# Patient Record
Sex: Male | Born: 1995 | Race: White | Hispanic: No | Marital: Single
Health system: Southern US, Community
[De-identification: ages and names within clinical notes are randomized; demographics above are authoritative.]

---

## 2016-04-18 ENCOUNTER — Emergency Department (HOSPITAL_COMMUNITY): Payer: BLUE CROSS/BLUE SHIELD | Admitting: Anesthesiology

## 2016-04-18 ENCOUNTER — Emergency Department (HOSPITAL_COMMUNITY): Payer: BLUE CROSS/BLUE SHIELD

## 2016-04-18 ENCOUNTER — Encounter (HOSPITAL_COMMUNITY): Payer: Self-pay

## 2016-04-18 ENCOUNTER — Encounter (HOSPITAL_COMMUNITY)
Admission: EM | Disposition: A | Discharge status: Home / Self Care | Payer: Self-pay | Source: Home / Self Care | Attending: Emergency Medicine

## 2016-04-18 ENCOUNTER — Ambulatory Visit (HOSPITAL_COMMUNITY)
Admission: EM | Admit: 2016-04-18 | Discharge: 2016-04-18 | Disposition: A | Discharge status: Home / Self Care | Payer: BLUE CROSS/BLUE SHIELD | Attending: Emergency Medicine | Admitting: Emergency Medicine

## 2016-04-18 DIAGNOSIS — S66922A Laceration of unspecified muscle, fascia and tendon at wrist and hand level, left hand, initial encounter: Secondary | ICD-10-CM

## 2016-04-18 DIAGNOSIS — Y939 Activity, unspecified: Secondary | ICD-10-CM | POA: Insufficient documentation

## 2016-04-18 DIAGNOSIS — S61412A Laceration without foreign body of left hand, initial encounter: Secondary | ICD-10-CM

## 2016-04-18 DIAGNOSIS — Y92410 Unspecified street and highway as the place of occurrence of the external cause: Secondary | ICD-10-CM | POA: Diagnosis not present

## 2016-04-18 DIAGNOSIS — Z01811 Encounter for preprocedural respiratory examination: Secondary | ICD-10-CM

## 2016-04-18 DIAGNOSIS — S66320A Laceration of extensor muscle, fascia and tendon of right index finger at wrist and hand level, initial encounter: Secondary | ICD-10-CM | POA: Diagnosis not present

## 2016-04-18 DIAGNOSIS — Z23 Encounter for immunization: Secondary | ICD-10-CM | POA: Insufficient documentation

## 2016-04-18 HISTORY — PX: NERVE AND ARTERY REPAIR: SHX5694

## 2016-04-18 LAB — CBC WITH DIFFERENTIAL/PLATELET
BASOS ABS: 0 10*3/uL (ref 0.0–0.1)
Basophils Relative: 0 %
EOS ABS: 0 10*3/uL (ref 0.0–0.7)
Eosinophils Relative: 0 %
HEMATOCRIT: 40.7 % (ref 39.0–52.0)
Hemoglobin: 14 g/dL (ref 13.0–17.0)
Lymphocytes Relative: 10 %
Lymphs Abs: 1.2 10*3/uL (ref 0.7–4.0)
MCH: 29.2 pg (ref 26.0–34.0)
MCHC: 34.4 g/dL (ref 30.0–36.0)
MCV: 84.8 fL (ref 78.0–100.0)
MONO ABS: 0.6 10*3/uL (ref 0.1–1.0)
MONOS PCT: 5 %
NEUTROS ABS: 10.3 10*3/uL — AB (ref 1.7–7.7)
Neutrophils Relative %: 85 %
PLATELETS: 128 10*3/uL — AB (ref 150–400)
RBC: 4.8 MIL/uL (ref 4.22–5.81)
RDW: 12.5 % (ref 11.5–15.5)
WBC: 12.2 10*3/uL — ABNORMAL HIGH (ref 4.0–10.5)

## 2016-04-18 LAB — BASIC METABOLIC PANEL
ANION GAP: 10 (ref 5–15)
BUN: 10 mg/dL (ref 6–20)
CALCIUM: 8.8 mg/dL — AB (ref 8.9–10.3)
CO2: 22 mmol/L (ref 22–32)
CREATININE: 0.83 mg/dL (ref 0.61–1.24)
Chloride: 108 mmol/L (ref 101–111)
GLUCOSE: 110 mg/dL — AB (ref 65–99)
Potassium: 3.3 mmol/L — ABNORMAL LOW (ref 3.5–5.1)
Sodium: 140 mmol/L (ref 135–145)

## 2016-04-18 LAB — ABO/RH: ABO/RH(D): O POS

## 2016-04-18 LAB — TYPE AND SCREEN
ABO/RH(D): O POS
ANTIBODY SCREEN: NEGATIVE

## 2016-04-18 SURGERY — NERVE AND ARTERY REPAIR
Anesthesia: Choice | Site: Hand | Laterality: Right

## 2016-04-18 MED ORDER — PROPOFOL 10 MG/ML IV BOLUS
INTRAVENOUS | Status: AC
  Filled 2016-04-18: qty 40

## 2016-04-18 MED ORDER — SODIUM CHLORIDE 0.9 % IJ SOLN
INTRAMUSCULAR | Status: AC
  Filled 2016-04-18: qty 10

## 2016-04-18 MED ORDER — MIDAZOLAM HCL 2 MG/2ML IJ SOLN
INTRAMUSCULAR | Status: AC
  Filled 2016-04-18: qty 2

## 2016-04-18 MED ORDER — FENTANYL CITRATE (PF) 100 MCG/2ML IJ SOLN
50.0000 ug | Freq: Once | INTRAMUSCULAR | Status: AC
  Administered 2016-04-18: 50 ug via INTRAVENOUS

## 2016-04-18 MED ORDER — BUPIVACAINE-EPINEPHRINE (PF) 0.5% -1:200000 IJ SOLN
INTRAMUSCULAR | Status: DC | PRN
  Administered 2016-04-18: 10 mL

## 2016-04-18 MED ORDER — BUPIVACAINE HCL (PF) 0.25 % IJ SOLN
INTRAMUSCULAR | Status: AC
  Filled 2016-04-18: qty 30

## 2016-04-18 MED ORDER — ROCURONIUM BROMIDE 50 MG/5ML IV SOSY
PREFILLED_SYRINGE | INTRAVENOUS | Status: AC
  Filled 2016-04-18: qty 5

## 2016-04-18 MED ORDER — TETANUS-DIPHTH-ACELL PERTUSSIS 5-2.5-18.5 LF-MCG/0.5 IM SUSP
0.5000 mL | Freq: Once | INTRAMUSCULAR | Status: AC
  Administered 2016-04-18: 0.5 mL via INTRAMUSCULAR
  Filled 2016-04-18: qty 0.5

## 2016-04-18 MED ORDER — FENTANYL CITRATE (PF) 250 MCG/5ML IJ SOLN
INTRAMUSCULAR | Status: DC | PRN
  Administered 2016-04-18 (×2): 100 ug via INTRAVENOUS

## 2016-04-18 MED ORDER — SODIUM CHLORIDE 0.9 % IV SOLN
INTRAVENOUS | Status: DC | PRN
  Administered 2016-04-18: 17:00:00 via INTRAVENOUS

## 2016-04-18 MED ORDER — ONDANSETRON HCL 4 MG/2ML IJ SOLN
INTRAMUSCULAR | Status: AC
  Filled 2016-04-18: qty 2

## 2016-04-18 MED ORDER — BUPIVACAINE-EPINEPHRINE (PF) 0.5% -1:200000 IJ SOLN
INTRAMUSCULAR | Status: AC
  Filled 2016-04-18: qty 30

## 2016-04-18 MED ORDER — MIDAZOLAM HCL 5 MG/5ML IJ SOLN
INTRAMUSCULAR | Status: DC | PRN
  Administered 2016-04-18: 2 mg via INTRAVENOUS

## 2016-04-18 MED ORDER — ARTIFICIAL TEARS OP OINT
TOPICAL_OINTMENT | OPHTHALMIC | Status: AC
  Filled 2016-04-18: qty 3.5

## 2016-04-18 MED ORDER — PHENYLEPHRINE 40 MCG/ML (10ML) SYRINGE FOR IV PUSH (FOR BLOOD PRESSURE SUPPORT)
PREFILLED_SYRINGE | INTRAVENOUS | Status: AC
  Filled 2016-04-18: qty 10

## 2016-04-18 MED ORDER — LIDOCAINE HCL (CARDIAC) 20 MG/ML IV SOLN
INTRAVENOUS | Status: DC | PRN
  Administered 2016-04-18: 40 mg via INTRATRACHEAL

## 2016-04-18 MED ORDER — ONDANSETRON HCL 4 MG/2ML IJ SOLN
INTRAMUSCULAR | Status: DC | PRN
  Administered 2016-04-18: 4 mg via INTRAVENOUS

## 2016-04-18 MED ORDER — SUCCINYLCHOLINE 20MG/ML (10ML) SYRINGE FOR MEDFUSION PUMP - OPTIME
INTRAMUSCULAR | Status: DC | PRN
  Administered 2016-04-18: 120 mg via INTRAVENOUS

## 2016-04-18 MED ORDER — LORAZEPAM 2 MG/ML IJ SOLN
0.5000 mg | Freq: Once | INTRAMUSCULAR | Status: AC
  Administered 2016-04-18: 0.5 mg via INTRAVENOUS
  Filled 2016-04-18: qty 1

## 2016-04-18 MED ORDER — FENTANYL CITRATE (PF) 100 MCG/2ML IJ SOLN
INTRAMUSCULAR | Status: AC
  Filled 2016-04-18: qty 4

## 2016-04-18 MED ORDER — LIDOCAINE 2% (20 MG/ML) 5 ML SYRINGE
INTRAMUSCULAR | Status: AC
  Filled 2016-04-18: qty 5

## 2016-04-18 MED ORDER — CEFAZOLIN IN D5W 1 GM/50ML IV SOLN
INTRAVENOUS | Status: DC | PRN
  Administered 2016-04-18: 1 g via INTRAVENOUS

## 2016-04-18 MED ORDER — EPHEDRINE 5 MG/ML INJ
INTRAVENOUS | Status: AC
  Filled 2016-04-18: qty 10

## 2016-04-18 MED ORDER — OXYCODONE-ACETAMINOPHEN 5-325 MG PO TABS: 1.0000 | ORAL_TABLET | ORAL | 0 refills | Status: AC | PRN

## 2016-04-18 MED ORDER — FENTANYL CITRATE (PF) 100 MCG/2ML IJ SOLN
100.0000 ug | Freq: Once | INTRAMUSCULAR | Status: DC
  Filled 2016-04-18: qty 2

## 2016-04-18 MED ORDER — 0.9 % SODIUM CHLORIDE (POUR BTL) OPTIME
TOPICAL | Status: DC | PRN
  Administered 2016-04-18: 1000 mL

## 2016-04-18 MED ORDER — LIDOCAINE-EPINEPHRINE (PF) 2 %-1:200000 IJ SOLN
20.0000 mL | Freq: Once | INTRAMUSCULAR | Status: AC
  Administered 2016-04-18: 20 mL

## 2016-04-18 MED ORDER — PROPOFOL 10 MG/ML IV BOLUS
INTRAVENOUS | Status: DC | PRN
  Administered 2016-04-18: 100 mg via INTRAVENOUS
  Administered 2016-04-18: 200 mg via INTRAVENOUS

## 2016-04-18 MED ORDER — CEFAZOLIN IN D5W 1 GM/50ML IV SOLN
1.0000 g | Freq: Once | INTRAVENOUS | Status: AC
  Administered 2016-04-18: 1 g via INTRAVENOUS
  Filled 2016-04-18: qty 50

## 2016-04-18 MED ORDER — SUCCINYLCHOLINE CHLORIDE 200 MG/10ML IV SOSY
PREFILLED_SYRINGE | INTRAVENOUS | Status: AC
  Filled 2016-04-18: qty 10

## 2016-04-18 MED ORDER — CHLORHEXIDINE GLUCONATE 4 % EX LIQD: 60.0000 mL | Freq: Once | CUTANEOUS | Status: DC

## 2016-04-18 MED ORDER — CEFAZOLIN SODIUM-DEXTROSE 2-4 GM/100ML-% IV SOLN
2.0000 g | INTRAVENOUS | Status: DC
Start: 2016-04-19 — End: 2016-04-18

## 2016-04-18 SURGICAL SUPPLY — 55 items
BANDAGE ACE 4X5 VEL STRL LF (GAUZE/BANDAGES/DRESSINGS) ×3 IMPLANT
BNDG CMPR 9X4 STRL LF SNTH (GAUZE/BANDAGES/DRESSINGS) ×1
BNDG ESMARK 4X9 LF (GAUZE/BANDAGES/DRESSINGS) ×3 IMPLANT
BNDG GAUZE ELAST 4 BULKY (GAUZE/BANDAGES/DRESSINGS) ×3 IMPLANT
CLOSURE WOUND 1/2 X4 (GAUZE/BANDAGES/DRESSINGS)
CORDS BIPOLAR (ELECTRODE) ×3 IMPLANT
COVER SURGICAL LIGHT HANDLE (MISCELLANEOUS) ×3 IMPLANT
CUFF TOURNIQUET SINGLE 18IN (TOURNIQUET CUFF) ×3 IMPLANT
CUFF TOURNIQUET SINGLE 24IN (TOURNIQUET CUFF) IMPLANT
DECANTER SPIKE VIAL GLASS SM (MISCELLANEOUS) ×3 IMPLANT
DRAPE OEC MINIVIEW 54X84 (DRAPES) IMPLANT
DRAPE SURG 17X23 STRL (DRAPES) ×3 IMPLANT
DURAPREP 26ML APPLICATOR (WOUND CARE) IMPLANT
GAUZE SPONGE 4X4 12PLY STRL (GAUZE/BANDAGES/DRESSINGS) ×3 IMPLANT
GAUZE XEROFORM 1X8 LF (GAUZE/BANDAGES/DRESSINGS) ×3 IMPLANT
GLOVE SURG SYN 8.0 (GLOVE) ×3 IMPLANT
GOWN STRL REUS W/ TWL LRG LVL3 (GOWN DISPOSABLE) ×1 IMPLANT
GOWN STRL REUS W/ TWL XL LVL3 (GOWN DISPOSABLE) ×1 IMPLANT
GOWN STRL REUS W/TWL LRG LVL3 (GOWN DISPOSABLE) ×3
GOWN STRL REUS W/TWL XL LVL3 (GOWN DISPOSABLE) ×3
KIT BASIN OR (CUSTOM PROCEDURE TRAY) ×3 IMPLANT
KIT ROOM TURNOVER OR (KITS) ×3 IMPLANT
LOOP VESSEL MAXI BLUE (MISCELLANEOUS) IMPLANT
MANIFOLD NEPTUNE II (INSTRUMENTS) IMPLANT
NEEDLE HYPO 25GX1X1/2 BEV (NEEDLE) ×3 IMPLANT
NS IRRIG 1000ML POUR BTL (IV SOLUTION) ×3 IMPLANT
PACK ORTHO EXTREMITY (CUSTOM PROCEDURE TRAY) ×3 IMPLANT
PAD ARMBOARD 7.5X6 YLW CONV (MISCELLANEOUS) ×6 IMPLANT
PAD CAST 4YDX4 CTTN HI CHSV (CAST SUPPLIES) ×1 IMPLANT
PADDING CAST COTTON 4X4 STRL (CAST SUPPLIES) ×3
SPEAR EYE SURG WECK-CEL (MISCELLANEOUS) ×6 IMPLANT
SPECIMEN JAR SMALL (MISCELLANEOUS) IMPLANT
STRIP CLOSURE SKIN 1/2X4 (GAUZE/BANDAGES/DRESSINGS) IMPLANT
SUCTION FRAZIER HANDLE 10FR (MISCELLANEOUS) ×2
SUCTION TUBE FRAZIER 10FR DISP (MISCELLANEOUS) ×1 IMPLANT
SUT ETHIBOND 3-0 V-5 (SUTURE) IMPLANT
SUT ETHILON 4 0 PS 2 18 (SUTURE) ×6 IMPLANT
SUT ETHILON 5 0 PS 2 18 (SUTURE) IMPLANT
SUT FIBERWIRE 3-0 18 TAPR NDL (SUTURE) ×3
SUT MERSILENE 4 0 P 3 (SUTURE) IMPLANT
SUT PROLENE 3 0 PS 2 (SUTURE) IMPLANT
SUT PROLENE 6 0 P 1 18 (SUTURE) ×3 IMPLANT
SUT SILK 4 0 PS 2 (SUTURE) IMPLANT
SUT VIC AB 3-0 FS2 27 (SUTURE) IMPLANT
SUT VIC AB 4-0 P-3 18X BRD (SUTURE) ×1 IMPLANT
SUT VIC AB 4-0 P3 18 (SUTURE) ×3
SUT VICRYL RAPIDE 4/0 PS 2 (SUTURE) IMPLANT
SUTURE FIBERWR 3-0 18 TAPR NDL (SUTURE) ×1 IMPLANT
SYR CONTROL 10ML LL (SYRINGE) ×3 IMPLANT
TOWEL OR 17X24 6PK STRL BLUE (TOWEL DISPOSABLE) ×3 IMPLANT
TOWEL OR 17X26 10 PK STRL BLUE (TOWEL DISPOSABLE) ×3 IMPLANT
TUBE CONNECTING 12'X1/4 (SUCTIONS) ×1
TUBE CONNECTING 12X1/4 (SUCTIONS) ×2 IMPLANT
UNDERPAD 30X30 (UNDERPADS AND DIAPERS) ×3 IMPLANT
WATER STERILE IRR 1000ML POUR (IV SOLUTION) ×3 IMPLANT
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
## 2016-04-18 NOTE — Transfer of Care (Signed)
Immediate Anesthesia Transfer of Care Note  Patient: Joe Manning  Procedure(s) Performed: Procedure(s): EXPLORATION AND REPAIRS AS NECESSARY TO LEFT  HAND (Right)  Patient Location: PACU  Anesthesia Type:General  Level of Consciousness: awake  Airway & Oxygen Therapy: Patient Spontanous Breathing  Post-op Assessment: Report given to RN and Post -op Vital signs reviewed and stable  Post vital signs: Reviewed and stable  Last Vitals:  Vitals:   04/18/16 1443 04/18/16 1630  BP: (!) 110/50 115/69  Pulse:  82  Resp: 12   Temp: 36.9 C     Last Pain:  Vitals:   04/18/16 1532  TempSrc:   PainSc: 5          Complications: No apparent anesthesia complications

## 2016-04-18 NOTE — Op Note (Signed)
See note [id number]
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
## 2016-04-18 NOTE — Anesthesia Preprocedure Evaluation (Addendum)
Anesthesia Evaluation  Patient identified by MRN, date of birth, ID band Patient awake    Reviewed: NPO status , Patient's Chart, lab work & pertinent test results, Unable to perform ROS - Chart review only  Airway Mallampati: I  TM Distance: >3 FB Neck ROM: Full    Dental  (+) Teeth Intact, Dental Advisory Given   Pulmonary    breath sounds clear to auscultation       Cardiovascular  Rhythm:Regular Rate:Normal     Neuro/Psych    GI/Hepatic   Endo/Other    Renal/GU      Musculoskeletal   Abdominal   Peds  Hematology   Anesthesia Other Findings   Reproductive/Obstetrics                            Anesthesia Physical Anesthesia Plan  ASA: I and emergent  Anesthesia Plan: General   Post-op Pain Management:    Induction: Rapid sequence, Cricoid pressure planned and Intravenous  Airway Management Planned: Oral ETT  Additional Equipment:   Intra-op Plan:   Post-operative Plan: Extubation in OR  Informed Consent: I have reviewed the patients History and Physical, chart, labs and discussed the procedure including the risks, benefits and alternatives for the proposed anesthesia with the patient or authorized representative who has indicated his/her understanding and acceptance.   Dental advisory given  Plan Discussed with: CRNA, Anesthesiologist and Surgeon  Anesthesia Plan Comments:         Anesthesia Quick Evaluation

## 2016-04-18 NOTE — ED Notes (Signed)
Consent for surgery signed by pt's mother.  Pt has had narcotic pain medication.

## 2016-04-18 NOTE — ED Notes (Signed)
PT TO OR.

## 2016-04-18 NOTE — ED Notes (Signed)
Dr. Ranae PalmsYelverton at bedside sutering pt lac on right arm

## 2016-04-18 NOTE — Anesthesia Procedure Notes (Signed)
Procedure Name: Intubation Date/Time: 04/18/2016 5:22 PM Performed by: Brien MatesMAHONY, Yu Peggs D Pre-anesthesia Checklist: Patient identified, Emergency Drugs available, Suction available, Patient being monitored and Timeout performed Patient Re-evaluated:Patient Re-evaluated prior to inductionOxygen Delivery Method: Circle system utilized Preoxygenation: Pre-oxygenation with 100% oxygen Intubation Type: IV induction, Rapid sequence and Cricoid Pressure applied Laryngoscope Size: Miller and 2 Grade View: Grade I Tube type: Oral Tube size: 7.5 mm Number of attempts: 1 Airway Equipment and Method: Stylet Placement Confirmation: ETT inserted through vocal cords under direct vision,  positive ETCO2 and breath sounds checked- equal and bilateral Secured at: 22 cm Tube secured with: Tape Dental Injury: Teeth and Oropharynx as per pre-operative assessment
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
## 2016-04-19 ENCOUNTER — Encounter (HOSPITAL_COMMUNITY): Payer: Self-pay | Admitting: Orthopedic Surgery

## 2016-04-19 NOTE — Anesthesia Postprocedure Evaluation (Addendum)
Anesthesia Post Note  Patient: Joe SkillJacob K Bice  Procedure(s) Performed: Procedure(s) (LRB): EXPLORATION AND REPAIR OF EXTENSOR TENDON ON DORSAL ASPECT OF RIGHT HAND. (Right)  Patient location during evaluation: PACU Anesthesia Type: General Level of consciousness: awake, awake and alert and oriented Pain management: pain level controlled Vital Signs Assessment: post-procedure vital signs reviewed and stable Respiratory status: spontaneous breathing, nonlabored ventilation and respiratory function stable Cardiovascular status: blood pressure returned to baseline Anesthetic complications: no       Last Vitals:  Vitals:   04/18/16 1845 04/18/16 1900  BP: 126/68 125/73  Pulse: 98 78  Resp: 15 12  Temp: 37.3 C 37.3 C    Last Pain:  Vitals:   04/18/16 1532  TempSrc:   PainSc: 5                  [name]
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
## 2016-10-28 NOTE — Addendum Note (Signed)
Addendum  created 10/28/16 1055 by Jacelyn Cuen, MD   Sign clinical note    
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
